# Patient Record
Sex: Male | Born: 1995 | Hispanic: Yes | Marital: Single
Health system: Southern US, Community
[De-identification: ages and names within clinical notes are randomized; demographics above are authoritative.]

---
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
## 2016-09-14 ENCOUNTER — Emergency Department
Admission: EM | Admit: 2016-09-14 | Discharge: 2016-09-14 | Disposition: A | Discharge status: Home / Self Care | Payer: BLUE CROSS/BLUE SHIELD | Attending: Emergency Medicine | Admitting: Emergency Medicine

## 2016-09-14 DIAGNOSIS — B029 Zoster without complications: Secondary | ICD-10-CM | POA: Diagnosis not present

## 2016-09-14 DIAGNOSIS — L989 Disorder of the skin and subcutaneous tissue, unspecified: Secondary | ICD-10-CM | POA: Diagnosis present

## 2016-09-14 MED ORDER — ACYCLOVIR 800 MG PO TABS: 800.0000 mg | ORAL_TABLET | Freq: Every day | ORAL | 0 refills | Status: AC

## 2016-09-14 MED ORDER — HYDROCODONE-ACETAMINOPHEN 5-325 MG PO TABS: 1.0000 | ORAL_TABLET | Freq: Four times a day (QID) | ORAL | 0 refills | Status: AC | PRN

## 2016-09-14 MED ORDER — ACYCLOVIR 200 MG PO CAPS
800.0000 mg | ORAL_CAPSULE | Freq: Once | ORAL | Status: AC
  Administered 2016-09-14: 800 mg via ORAL
  Filled 2016-09-14: qty 4

## 2016-09-14 NOTE — ED Provider Notes (Signed)
[facility] Emergency Department Provider Note  ____________________________________________   First MD Initiated Contact with Patient 09/14/16 1718     (approximate)  I have reviewed the triage vital signs and the nursing notes.   HISTORY  Chief Complaint Skin Ulcer   HPI Kenneth Wu is a 21 y.o. male patient is here with a outbreak on the left posterior scalp. Patient states it began 3 days ago. Initially it was itchy and now has become very sore. He stopped at a pharmacy today and the pharmacist told him it was a fungal infection. He was sold some fungal cream that he is applied to the area without any relief.He rates his pain as an 8 out of 10.   No past medical history on file.  There are no active problems to display for this patient.   No past surgical history on file.  Prior to Admission medications   Medication Sig Start Date End Date Taking? Authorizing Provider  acyclovir (ZOVIRAX) 800 MG tablet Take 1 tablet (800 mg total) by mouth 5 (five) times daily. 09/14/16   Tommi RumpsSummers, Kamaury Cutbirth L, PA-C  HYDROcodone-acetaminophen (NORCO/VICODIN) 5-325 MG tablet Take 1 tablet by mouth every 6 (six) hours as needed for moderate pain. 09/14/16   Tommi RumpsSummers, Christe Tellez L, PA-C    Allergies Patient has no known allergies.  No family history on file.  Social History Social History  Substance Use Topics  . Smoking status: Not on file  . Smokeless tobacco: Not on file  . Alcohol use Not on file    Review of Systems Constitutional: No fever/chills Cardiovascular: Denies chest pain. Respiratory: Denies shortness of breath. Gastrointestinal:   No nausea, no vomiting.  Musculoskeletal: Negative for back pain. Skin: Positive for rash on scalp. Neurological: Negative for headaches, focal weakness or numbness.   ____________________________________________   PHYSICAL EXAM:  VITAL SIGNS: ED Triage Vitals  Enc Vitals Group     BP 09/14/16 1700 127/87       Pulse Rate 09/14/16 1700 100     Resp 09/14/16 1700 18     Temp 09/14/16 1700 99.3 F (37.4 C)     Temp Source 09/14/16 1700 Oral     SpO2 09/14/16 1700 98 %     Weight 09/14/16 1700 185 lb (83.9 kg)     Height 09/14/16 1700 5\' 6"  (1.676 m)     Head Circumference --      Peak Flow --      Pain Score 09/14/16 1659 8     Pain Loc --      Pain Edu? --      Excl. in GC? --     Constitutional: Alert and oriented. Well appearing and in no acute distress. Eyes: Conjunctivae are normal.  Head: Atraumatic. Nose: No congestion/rhinnorhea. Neck: No stridor.   Cardiovascular: Normal rate, regular rhythm. Grossly normal heart sounds.  Good peripheral circulation. Respiratory: Normal respiratory effort.  No retractions. Lungs CTAB. Musculoskeletal: Moves upper and lower extremities without any difficulty. Normal gait was noted. Neurologic:  Normal speech and language. No gross focal neurologic deficits are appreciated. No gait instability. Skin:  Skin is warm, dry.  Posterior scalp there is a cluster of vesicles with some open no active drainage. This was seen after removing antifungal cream from the area. There is also one older vesicle at the nape of the neck which is tender to touch. Cluster is also tender to palpation. Psychiatric: Mood and affect are normal. Speech and behavior are  normal.  ____________________________________________   LABS (all labs ordered are listed, but only abnormal results are displayed)  Labs Reviewed - No data to display  PROCEDURES  Procedure(s) performed: None  Procedures  Critical Care performed: No  ____________________________________________   INITIAL IMPRESSION / ASSESSMENT AND PLAN / ED COURSE  Pertinent labs & imaging results that were available during my care of the patient were reviewed by me and considered in my medical decision making (see chart for details).  Patient is follow-up with [facility] if any continued  problems. Patient was started on Zovirax 800 mg 5 times per day for the next 7 days. He is also given a prescription for Norco as needed for pain. He is aware that he cannot drive or be at work while taking this medication because of drowsiness and increase risk for injury. We also discussed his wife's exposure to shingles since she is 6 months pregnant. Patient was given his first doses Zovirax here as the pharmacies closed early on Sundays.   ____________________________________________   FINAL CLINICAL IMPRESSION(S) / ED DIAGNOSES  Final diagnoses:  Herpes zoster without complication      NEW MEDICATIONS STARTED DURING THIS VISIT:  New Prescriptions   ACYCLOVIR (ZOVIRAX) 800 MG TABLET    Take 1 tablet (800 mg total) by mouth 5 (five) times daily.   HYDROCODONE-ACETAMINOPHEN (NORCO/VICODIN) 5-325 MG TABLET    Take 1 tablet by mouth every 6 (six) hours as needed for moderate pain.     Note:  This document was prepared using Dragon voice recognition software and may include unintentional dictation errors.    Tommi RumpsSummers, Necola Bluestein L, PA-C 09/14/16 Cloyd [id number]    Goodman, Graydon, MD 09/14/16 [phone]

## 2016-09-14 NOTE — ED Triage Notes (Signed)
Pt reports that on Thursday he noticed a cluster of blisters to the back left side of his head, pt reports thinking it was a fungus, pt denies hx of shingles, states that he does have a hx of cold sores to his mouth. Pt reports some discomfort to the left side of his neck, no rash or blisters noted there, pt has been using antifungal cream on the area, pt is with his significant other who is 6 mos pregnant

## 2016-09-14 NOTE — Discharge Instructions (Signed)
Been taking Zovirax 800 mg 5 times per day for the next 7 days. Norco as needed for pain. [facility] if any continued problems.

## 2016-09-14 NOTE — ED Notes (Signed)
Pt with painful red rash post neck
